# Patient Record
Sex: Male | Born: 1990 | Race: White | Hispanic: No | Marital: Single | Smoking: Former smoker
Health system: Southern US, Community
[De-identification: ages and names within clinical notes are randomized; demographics above are authoritative.]

## PROBLEM LIST (undated history)

## (undated) DIAGNOSIS — N200 Calculus of kidney: Secondary | ICD-10-CM

---

## 2009-04-25 ENCOUNTER — Emergency Department (HOSPITAL_COMMUNITY): Admission: EM | Admit: 2009-04-25 | Discharge: 2009-04-25 | Payer: Self-pay | Admitting: Emergency Medicine

## 2009-08-03 ENCOUNTER — Emergency Department (HOSPITAL_COMMUNITY): Admission: EM | Admit: 2009-08-03 | Discharge: 2009-08-04 | Payer: Self-pay | Admitting: Emergency Medicine

## 2010-06-04 LAB — POCT I-STAT, CHEM 8
Creatinine, Ser: 0.9 mg/dL (ref 0.4–1.5)
Glucose, Bld: 86 mg/dL (ref 70–99)
Hemoglobin: 18 g/dL — ABNORMAL HIGH (ref 13.0–17.0)
Potassium: 3.5 mEq/L (ref 3.5–5.1)
Sodium: 139 mEq/L (ref 135–145)
TCO2: 27 mmol/L (ref 0–100)

## 2011-07-21 IMAGING — CR DG FOOT COMPLETE 3+V*R*
3 series · 3 of 3 positions shown · non-contrast
Comparison: None.

CLINICAL DATA: Lateral pain

RIGHT FOOT COMPLETE - 3+ VIEW

[t foot ap right]
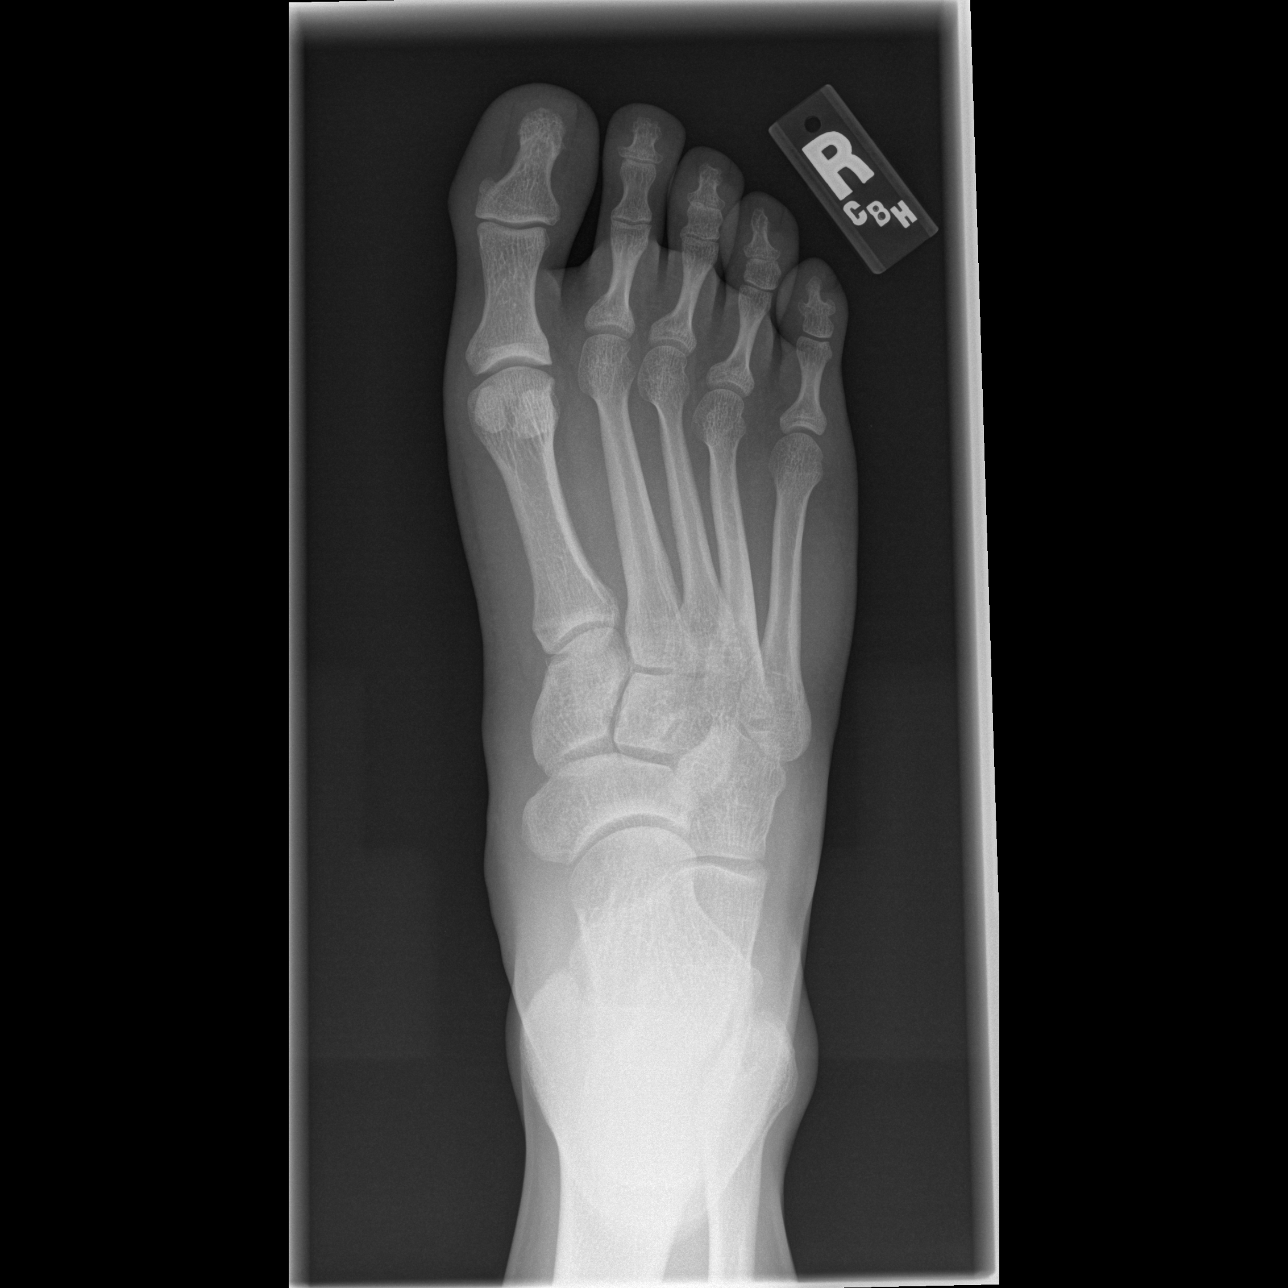

[t foot oblique right]
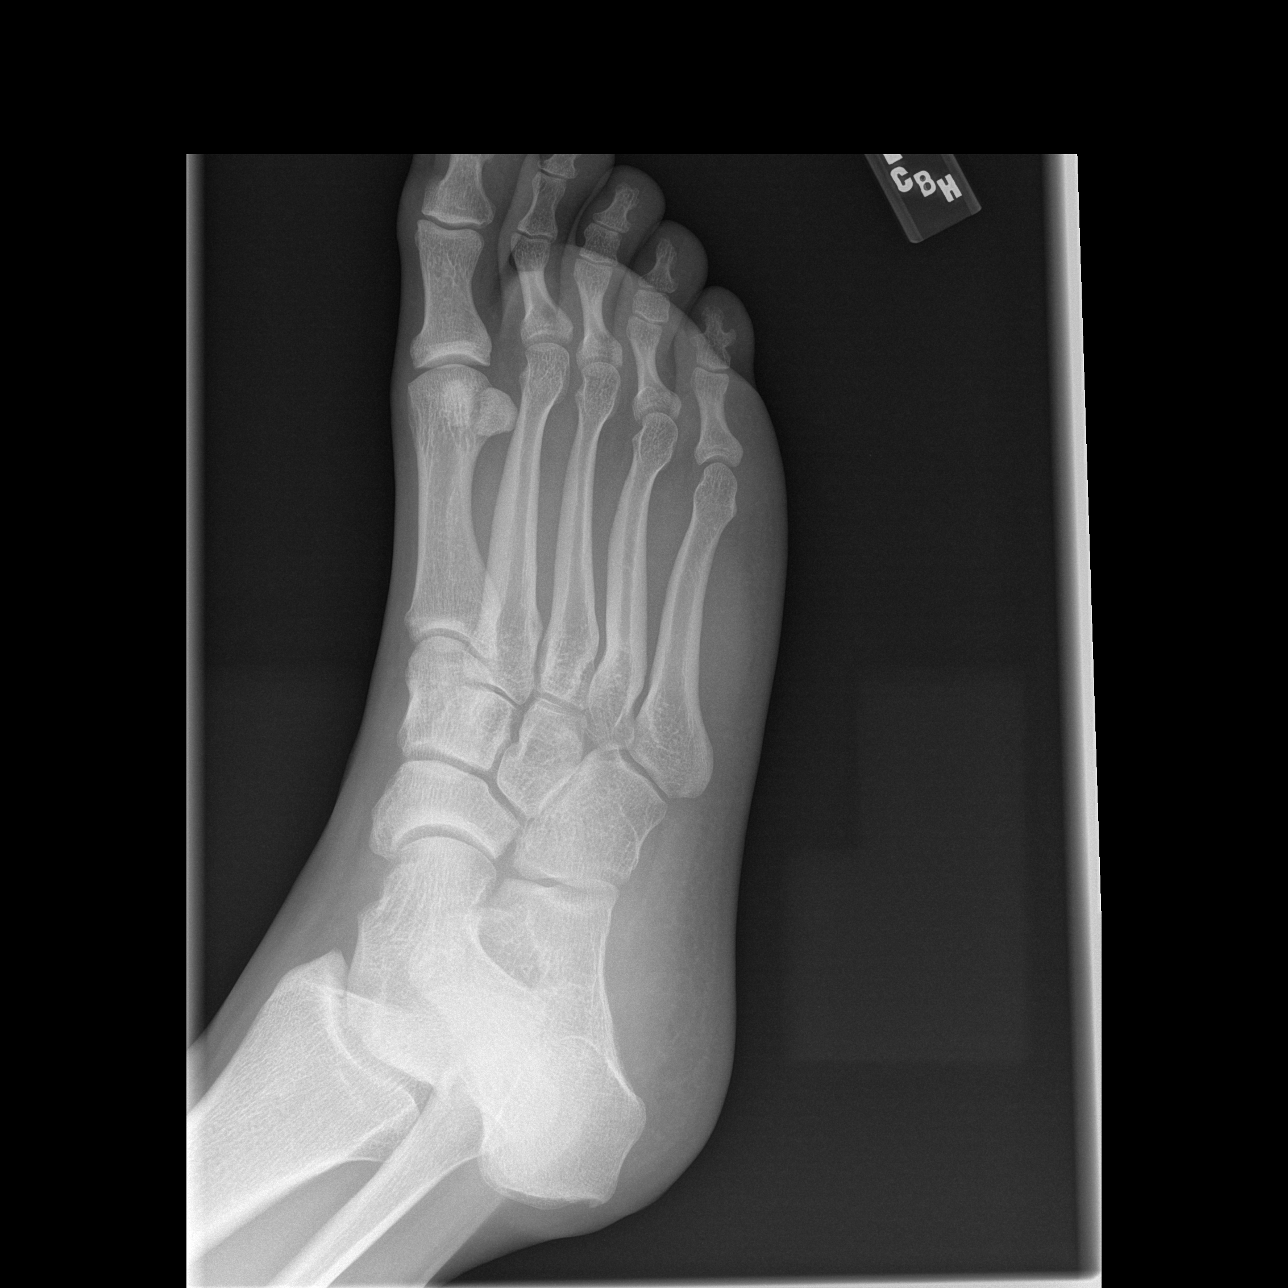

[t foot lat right]
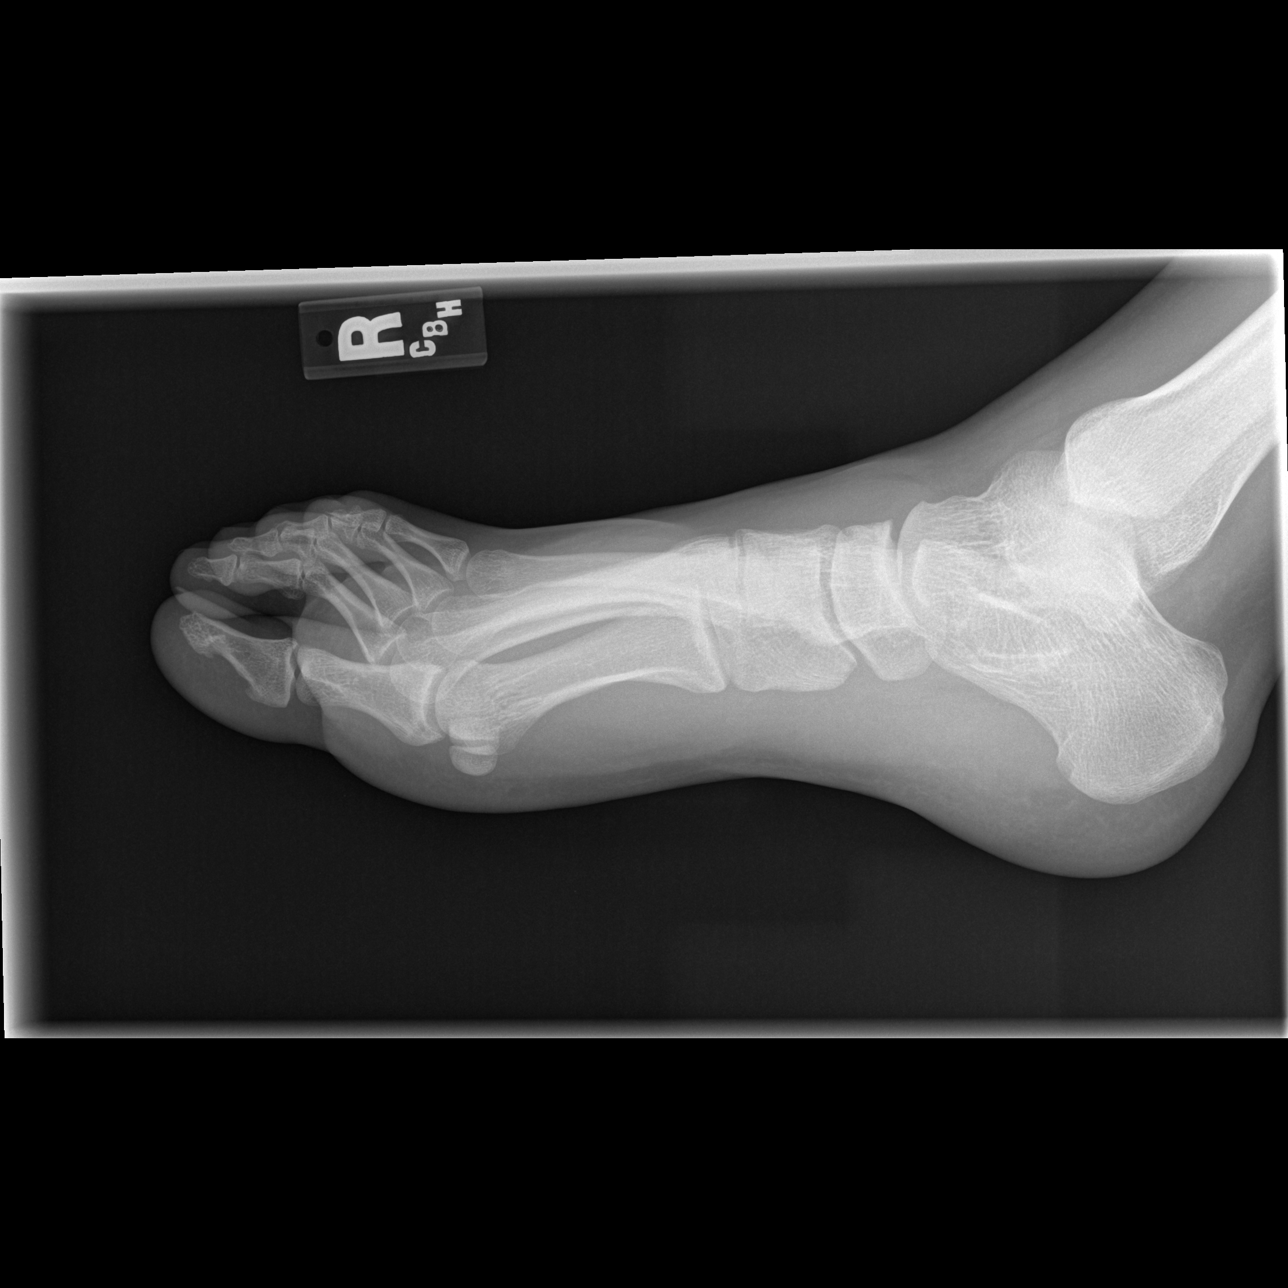

[3 of 3 positions shown; findings below may reference images not displayed]

FINDINGS: There is no evidence of fracture or dislocation.  There
is no evidence of arthropathy or other focal bony abnormality.
Soft tissues are unremarkable.
IMPRESSION: Negative

## 2014-12-28 ENCOUNTER — Emergency Department (HOSPITAL_COMMUNITY)
Admission: EM | Admit: 2014-12-28 | Discharge: 2014-12-28 | Disposition: A | Discharge status: Home / Self Care | Payer: Self-pay | Attending: Emergency Medicine | Admitting: Emergency Medicine

## 2014-12-28 ENCOUNTER — Encounter (HOSPITAL_COMMUNITY): Payer: Self-pay | Admitting: Emergency Medicine

## 2014-12-28 DIAGNOSIS — L0501 Pilonidal cyst with abscess: Secondary | ICD-10-CM | POA: Insufficient documentation

## 2014-12-28 DIAGNOSIS — Z87891 Personal history of nicotine dependence: Secondary | ICD-10-CM | POA: Insufficient documentation

## 2014-12-28 MED ORDER — ONDANSETRON 4 MG PO TBDP
4.0000 mg | ORAL_TABLET | Freq: Once | ORAL | Status: AC
  Administered 2014-12-28: 4 mg via ORAL
  Filled 2014-12-28: qty 1

## 2014-12-28 MED ORDER — LIDOCAINE-EPINEPHRINE 2 %-1:200000 IJ SOLN
10.0000 mL | Freq: Once | INTRAMUSCULAR | Status: AC
Start: 2014-12-28 — End: 2014-12-28
  Administered 2014-12-28: 10 mL
  Filled 2014-12-28: qty 20

## 2014-12-28 MED ORDER — HYDROCODONE-ACETAMINOPHEN 5-325 MG PO TABS: 1.0000 | ORAL_TABLET | ORAL | Status: AC | PRN

## 2014-12-28 MED ORDER — HYDROCODONE-ACETAMINOPHEN 5-325 MG PO TABS
2.0000 | ORAL_TABLET | Freq: Once | ORAL | Status: AC
  Administered 2014-12-28: 2 via ORAL
  Filled 2014-12-28: qty 2

## 2014-12-28 NOTE — ED Notes (Signed)
PA at bedside draining abscess

## 2014-12-28 NOTE — ED Provider Notes (Signed)
CSN: [id number]     Arrival date & time 12/28/14  0536 History   First MD Initiated Contact with Patient 12/28/14 [phone]     Chief Complaint  Patient presents with  . Abscess     (Consider location/radiation/quality/duration/timing/severity/associated sxs/prior Treatment) Patient is a 24 y.o. male presenting with abscess. The history is provided by the patient. No language interpreter was used.  Abscess Location:  Ano-genital Ano-genital abscess location:  Gluteal cleft Size:  3cm fluctuance, 6cm induration Abscess quality: fluctuance, induration, painful, redness and warmth   Red streaking: no   Duration:  1 week Progression:  Worsening Pain details:    Quality:  Pressure   Severity:  Severe   Duration:  1 week   Timing:  Constant   Progression:  Worsening Chronicity:  New Context: not diabetes, not immunosuppression, not injected drug use, not insect bite/sting and not skin injury   Relieved by:  Nothing Ineffective treatments:  NSAIDs Associated symptoms: no anorexia, no fatigue, no fever, no headaches, no nausea and no vomiting   Associated symptoms comment:  Chills Risk factors: no hx of MRSA and no prior abscess     History reviewed. No pertinent past medical history. History reviewed. No pertinent past surgical history. No family history on file. Social History  Substance Use Topics  . Smoking status: Former Games developer  . Smokeless tobacco: Current User  . Alcohol Use: No    Review of Systems  Constitutional: Negative for fever and fatigue.  Gastrointestinal: Negative for nausea, vomiting and anorexia.  Neurological: Negative for headaches.      Allergies  Abilify  Home Medications   Prior to Admission medications   Medication Sig Start Date End Date Taking? Authorizing Provider  ibuprofen (ADVIL,MOTRIN) 200 MG tablet Take 200 mg by mouth every 6 (six) hours as needed for mild pain.   Yes Historical Provider, MD  HYDROcodone-acetaminophen (NORCO) 5-325 MG  tablet Take 1-2 tablets by mouth every 4 (four) hours as needed. 12/28/14   Cyniah Gossard, PA-C   BP 129/58 mmHg  Pulse 87  Temp(Src) 98.3 F (36.8 C) (Oral)  Resp 16  Ht  (1.854 m)  Wt 230 lb (104.327 kg)  BMI 30.35 kg/m2  SpO2 99% Physical Exam  Constitutional: He appears well-developed and well-nourished. No distress.  HENT:  Head: Normocephalic and atraumatic.  Eyes: Conjunctivae are normal. No scleral icterus.  Neck: Normal range of motion. Neck supple.  Cardiovascular: Normal rate, regular rhythm and normal heart sounds.   Pulmonary/Chest: Effort normal and breath sounds normal. No respiratory distress.  Abdominal: Soft. There is no tenderness.  Musculoskeletal: He exhibits no edema.       Back:  Neurological: He is alert.  Skin: Skin is warm and dry. He is not diaphoretic.  Psychiatric: His behavior is normal.  Nursing note and vitals reviewed.   ED Course  .Marland KitchenIncision and Drainage Date/Time: 12/28/2014 7:47 AM Performed by: Arthor Captain Authorized by: Arthor Captain Consent: Verbal consent obtained. Risks and benefits: risks, benefits and alternatives were discussed Patient identity confirmed: provided demographic data Time out: Immediately prior to procedure a "time out" was called to verify the correct patient, procedure, equipment, support staff and site/side marked as required. Type: pilonidal cyst Body area: anogenital Location details: gluteal cleft Anesthesia: local infiltration Local anesthetic: lidocaine 2% with epinephrine Anesthetic total: 4 ml Patient sedated: no Scalpel size: 11 Incision type: single straight Incision depth: dermal Complexity: simple Drainage: purulent Drainage amount: moderate Wound treatment: wound left open Packing material:  none Patient tolerance: Patient tolerated the procedure well with no immediate complications   (including critical care time) Labs Review Labs Reviewed - No data to display  Imaging  Review No results found. I have personally reviewed and evaluated these images and lab results as part of my medical decision-making.   EKG Interpretation None      MDM   Final diagnoses:  Pilonidal abscess   Patient with skin abscess amenable to incision and drainage.  Abscess was not large enough to warrant packing or drain placement,  wound recheck in 2 days. Supportive care and return precautions discussed. Will d/c to home.  No antibiotic therapy is indicated.       Arthor CaptainAbigail Tamani Durney, PA-C 12/28/14 [id number]  Derwood KaplanAnkit Nanavati, MD 12/28/14 [phone]

## 2014-12-28 NOTE — Discharge Instructions (Signed)
Gentle cleansing 2x day. Change dressing 2 x day.  Incision and Drainage of a Pilonidal Cyst Incision and drainage is a surgical procedure to open and drain a fluid-filled sac that forms around a hair follicle in the tailbone area between your buttocks (pilonidal cyst). You may need this procedure if the cyst becomes painful, swollen, or infected. There are three types of procedures that may be done. The type of procedure you have depends on the size and severity of your infected cyst. The procedure may be:  Incision and drainage with a special type of bandage (wound packing). Packing is used for wounds that are deep or tunnel under the skin.  Marsupialization. In this procedure, the cyst will be opened and kept open. The edges of the incision will be stitched together to make a pocket.  Incision and drainage without wound packing. LET [facility] PROVIDER KNOW ABOUT:  Any allergies you have.  All medicines you are taking, including vitamins, herbs, eye drops, creams, and over-the-counter medicines.  Previous problems you or members of your family have had with the use of anesthetics.  Any blood disorders you have.  Previous surgeries you have had.  Medical conditions you have. RISKS AND COMPLICATIONS Generally, this is a safe procedure. However, problems can occur and include:  Infection.  Bleeding.  Having another cyst develop.  Need for more surgery. BEFORE THE PROCEDURE  Ask your health care provider about:  Changing or stopping your regular medicines. This is especially important if you are taking diabetes medicines or blood thinners.  Taking medicines such as aspirin and ibuprofen. These medicines can thin your blood. Do not take these medicines before your procedure if your health care provider tells you not to.  Taking antibiotics before surgery to control the infection.  Do not eat or drink anything for 6-8 hours before the procedure if you are having general  anesthesia.  Take a shower the night before the procedure to clean your buttocks area. Take another shower in the morning before surgery.  Plan to have someone take you home after the procedure. PROCEDURE   You will have an IV tube inserted in a vein in your hand or arm.  You will be given one of the following:  A medicine that numbs the area (local anesthetic).  A medicine that makes you go to sleep (general anesthetic).  You also may be given medicine to help you relax during the procedure (sedative).  You will lie face down on the operating table.  Your buttocks area may be shaved.  Tape may be used to spread your buttocks.  Germ-killing solution (antiseptic) may be used to clean the area. Incision and Drainage With Wound Packing:  Your surgeon will make a surgical cut (incision) over the cyst to open it.  A probe may be used to see if there are tunnels extending away from the cyst under your skin.  Fluid or pus inside the cyst will be drained.  The cyst will be flushed out with a germ-free (sterile) solution.  Packing will be placed into the open cyst. This keeps it open and draining after surgery.  The area will be covered with a bandage (dressing). Marsupialization:  Your surgeon will make a surgical cut (incision) over the cyst to open it.  A probe may be used to see if there are tunnels extending away from the cyst under your skin.  Fluid or pus inside the cyst will be drained.  The cyst will be flushed  out with a germ-free (sterile) solution.  The edges of the incision will be stitched (sutured) to the skin to keep it wide open. The cyst will not be packed.  A rolled-up bandage (dressing) will be taped over the incision. Incision and Drainage Without Packing:  Your surgeon will make a surgical cut (incision) over the cyst to open it.  A probe may be used to see if there are tunnels extending away from the cyst under your skin.  Fluid or pus inside  the cyst will be drained.  The cyst will be flushed out with a germ-free (sterile) solution.  Your surgeon may also remove the tissue around the opened cyst.  The incision then will be closed with stitches (sutures). It will not be left open, and packing will not be used.  A bandage (dressing) will be put over the incision area. AFTER THE PROCEDURE  If you had general anesthesia, you will be taken to a recovery area. Your blood pressure, heart rate, breathing rate, and blood oxygen level will be monitored often until the medicines you were given have worn off.  It is normal to have some pain after this procedure. You may be given pain medicine.  Your IV tube can be taken out after you have recovered and your pain is under control.   This information is not intended to replace advice given to you by your health care provider. Make sure you discuss any questions you have with your health care provider.   Document Released: 09/29/2013 Document Reviewed: 09/29/2013 Elsevier Interactive Patient Education 2016 [employer] Incision and Drainage, Care After Refer to this sheet in the next few weeks. These instructions provide you with information on caring for yourself after your procedure. Your caregiver may also give you more specific instructions. Your treatment has been planned according to current medical practices, but problems sometimes occur. Call your caregiver if you have any problems or questions after your procedure. HOME CARE INSTRUCTIONS   If antibiotic medicine is given, take it as directed. Finish it even if you start to feel better.  Only take over-the-counter or prescription medicines for pain, discomfort, or fever as directed by your caregiver.  Keep all follow-up appointments as directed by your caregiver.  Change any bandages (dressings) as directed by your caregiver. Replace old dressings with clean dressings.  Wash your hands before and after caring for your  wound. You will receive specific instructions for cleansing and caring for your wound.  SEEK MEDICAL CARE IF:   You have increased pain, swelling, or redness around the wound.  You have increased drainage, smell, or bleeding from the wound.  You have muscle aches, chills, or you feel generally sick.  You have a fever. MAKE SURE YOU:   Understand these instructions.  Will watch your condition.  Will get help right away if you are not doing well or get worse.   This information is not intended to replace advice given to you by your health care provider. Make sure you discuss any questions you have with your health care provider.   Document Released: 05/27/2011 Document Revised: 03/25/2014 Document Reviewed: 05/27/2011 Elsevier Interactive Patient Education [employer]

## 2014-12-28 NOTE — ED Notes (Signed)
Pt in from home reporting lower back pain, present for past several days. Denies injury, denies numbness/tingling, denies bowel/bladder issues

## 2015-07-14 ENCOUNTER — Emergency Department (HOSPITAL_COMMUNITY)
Admission: EM | Admit: 2015-07-14 | Discharge: 2015-07-14 | Disposition: A | Discharge status: Home / Self Care | Payer: Self-pay | Attending: Emergency Medicine | Admitting: Emergency Medicine

## 2015-07-14 ENCOUNTER — Encounter (HOSPITAL_COMMUNITY): Payer: Self-pay

## 2015-07-14 DIAGNOSIS — R1013 Epigastric pain: Secondary | ICD-10-CM | POA: Insufficient documentation

## 2015-07-14 DIAGNOSIS — Z87891 Personal history of nicotine dependence: Secondary | ICD-10-CM | POA: Insufficient documentation

## 2015-07-14 DIAGNOSIS — Z87442 Personal history of urinary calculi: Secondary | ICD-10-CM | POA: Insufficient documentation

## 2015-07-14 DIAGNOSIS — R112 Nausea with vomiting, unspecified: Secondary | ICD-10-CM | POA: Insufficient documentation

## 2015-07-14 DIAGNOSIS — R319 Hematuria, unspecified: Secondary | ICD-10-CM | POA: Insufficient documentation

## 2015-07-14 HISTORY — DX: Calculus of kidney: N20.0

## 2015-07-14 LAB — CBC WITH DIFFERENTIAL/PLATELET
Basophils Absolute: 0 10*3/uL (ref 0.0–0.1)
Basophils Relative: 0 %
EOS PCT: 3 %
Eosinophils Absolute: 0.2 10*3/uL (ref 0.0–0.7)
HCT: 46.2 % (ref 39.0–52.0)
Hemoglobin: 15.6 g/dL (ref 13.0–17.0)
LYMPHS ABS: 1.9 10*3/uL (ref 0.7–4.0)
LYMPHS PCT: 37 %
MCH: 28.5 pg (ref 26.0–34.0)
MCHC: 33.8 g/dL (ref 30.0–36.0)
MCV: 84.3 fL (ref 78.0–100.0)
MONO ABS: 0.5 10*3/uL (ref 0.1–1.0)
MONOS PCT: 9 %
Neutro Abs: 2.6 10*3/uL (ref 1.7–7.7)
Neutrophils Relative %: 51 %
PLATELETS: 192 10*3/uL (ref 150–400)
RBC: 5.48 MIL/uL (ref 4.22–5.81)
RDW: 13 % (ref 11.5–15.5)
WBC: 5.1 10*3/uL (ref 4.0–10.5)

## 2015-07-14 LAB — URINALYSIS, ROUTINE W REFLEX MICROSCOPIC
BILIRUBIN URINE: NEGATIVE
Glucose, UA: NEGATIVE mg/dL
HGB URINE DIPSTICK: NEGATIVE
Ketones, ur: NEGATIVE mg/dL
Leukocytes, UA: NEGATIVE
Nitrite: NEGATIVE
PROTEIN: NEGATIVE mg/dL
Specific Gravity, Urine: 1.021 (ref 1.005–1.030)
pH: 5 (ref 5.0–8.0)

## 2015-07-14 LAB — COMPREHENSIVE METABOLIC PANEL
ALT: 54 U/L (ref 17–63)
ANION GAP: 9 (ref 5–15)
AST: 32 U/L (ref 15–41)
Albumin: 4.2 g/dL (ref 3.5–5.0)
Alkaline Phosphatase: 89 U/L (ref 38–126)
BUN: 12 mg/dL (ref 6–20)
CHLORIDE: 108 mmol/L (ref 101–111)
CO2: 23 mmol/L (ref 22–32)
CREATININE: 1.05 mg/dL (ref 0.61–1.24)
Calcium: 9.5 mg/dL (ref 8.9–10.3)
Glucose, Bld: 119 mg/dL — ABNORMAL HIGH (ref 65–99)
Potassium: 4.2 mmol/L (ref 3.5–5.1)
Sodium: 140 mmol/L (ref 135–145)
Total Bilirubin: 0.6 mg/dL (ref 0.3–1.2)
Total Protein: 7 g/dL (ref 6.5–8.1)

## 2015-07-14 LAB — LIPASE, BLOOD: LIPASE: 43 U/L (ref 11–51)

## 2015-07-14 LAB — I-STAT TROPONIN, ED: TROPONIN I, POC: 0 ng/mL (ref 0.00–0.08)

## 2015-07-14 MED ORDER — SODIUM CHLORIDE 0.9 % IV BOLUS (SEPSIS)
1000.0000 mL | Freq: Once | INTRAVENOUS | Status: AC
  Administered 2015-07-14: 1000 mL via INTRAVENOUS

## 2015-07-14 MED ORDER — FAMOTIDINE IN NACL 20-0.9 MG/50ML-% IV SOLN
20.0000 mg | Freq: Once | INTRAVENOUS | Status: AC
  Administered 2015-07-14: 20 mg via INTRAVENOUS
  Filled 2015-07-14: qty 50

## 2015-07-14 MED ORDER — FAMOTIDINE 20 MG PO TABS: 20.0000 mg | ORAL_TABLET | Freq: Two times a day (BID) | ORAL | Status: AC

## 2015-07-14 MED ORDER — ONDANSETRON 4 MG PO TBDP: 4.0000 mg | ORAL_TABLET | Freq: Three times a day (TID) | ORAL | Status: AC | PRN

## 2015-07-14 MED ORDER — ONDANSETRON HCL 4 MG/2ML IJ SOLN
4.0000 mg | Freq: Once | INTRAMUSCULAR | Status: AC
  Administered 2015-07-14: 4 mg via INTRAVENOUS
  Filled 2015-07-14: qty 2

## 2015-07-14 MED ORDER — PANTOPRAZOLE SODIUM 20 MG PO TBEC: 20.0000 mg | DELAYED_RELEASE_TABLET | Freq: Every day | ORAL | Status: AC

## 2015-07-14 MED ORDER — GI COCKTAIL ~~LOC~~
30.0000 mL | Freq: Once | ORAL | Status: AC
  Administered 2015-07-14: 30 mL via ORAL
  Filled 2015-07-14: qty 30

## 2015-07-14 NOTE — ED Provider Notes (Signed)
CSN: [id number]     Arrival date & time 07/14/15  1034 History   First MD Initiated Contact with Patient 07/14/15 1047     Chief Complaint  Patient presents with  . Hematuria  . Abdominal Pain     (Consider location/radiation/quality/duration/timing/severity/associated sxs/prior Treatment) HPI   Dalton Lindsey is a 25 y.o. male, with a history of kidney stone, presenting to the ED with Intermittent epigastric pain accompanied by nausea and vomiting for the last week. Patient also complains of hematuria for the past 2 days. Patient endorses a previous history of a kidney stone, however, today's pain is different and more intense. Patient rates his pain at 5 out of 10, describes it as a squeezing or cramping, nonradiating. The pain does not seem to be associated with eating, however, the vomiting only occurs when he eats. The pain is worse when the patient lifts heavy objects. Patient has not tried anything for the pain. Patient denies lower abdominal pain, pain with bowel movements, penile discharge, dysuria, fever/chills, hematochezia/melena, chest pain, shortness breath, or any other complaints.    Past Medical History  Diagnosis Date  . Kidney stone    History reviewed. No pertinent past surgical history. History reviewed. No pertinent family history. Social History  Substance Use Topics  . Smoking status: Former Games developermoker  . Smokeless tobacco: Current User  . Alcohol Use: No    Review of Systems  Constitutional: Negative for fever, chills and diaphoresis.  Respiratory: Negative for cough and shortness of breath.   Cardiovascular: Negative for chest pain.  Gastrointestinal: Positive for nausea, vomiting and abdominal pain. Negative for diarrhea, constipation and blood in stool.  Genitourinary: Positive for hematuria. Negative for dysuria, flank pain, decreased urine volume, discharge, penile swelling, scrotal swelling, penile pain and testicular pain.  Musculoskeletal: Negative for  back pain.  Skin: Negative for color change, pallor and rash.  All other systems reviewed and are negative.     Allergies  Abilify  Home Medications   Prior to Admission medications   Medication Sig Start Date End Date Taking? Authorizing Provider  famotidine (PEPCID) 20 MG tablet Take 1 tablet (20 mg total) by mouth 2 (two) times daily. 07/14/15   Juleen Sorrels C Griselda Tosh, PA-C  HYDROcodone-acetaminophen (NORCO) 5-325 MG tablet Take 1-2 tablets by mouth every 4 (four) hours as needed. 12/28/14   Arthor CaptainAbigail Harris, PA-C  ibuprofen (ADVIL,MOTRIN) 200 MG tablet Take 200 mg by mouth every 6 (six) hours as needed for mild pain.    Historical Provider, MD  ondansetron (ZOFRAN ODT) 4 MG disintegrating tablet Take 1 tablet (4 mg total) by mouth every 8 (eight) hours as needed for nausea or vomiting. 07/14/15   Haidy Kackley C Roquel Burgin, PA-C  pantoprazole (PROTONIX) 20 MG tablet Take 1 tablet (20 mg total) by mouth daily. 07/14/15   Seymore Brodowski C Korianna Washer, PA-C   BP 121/56 mmHg  Pulse 77  Temp(Src) 98.7 F (37.1 C) (Oral)  Resp 18  Ht 6\' 1"  (1.854 m)  Wt 108.863 kg  BMI 31.67 kg/m2  SpO2 96% Physical Exam  Constitutional: He appears well-developed and well-nourished. No distress.  HENT:  Head: Normocephalic and atraumatic.  Eyes: Conjunctivae are normal. Pupils are equal, round, and reactive to light.  Neck: Neck supple.  Cardiovascular: Normal rate, regular rhythm, normal heart sounds and intact distal pulses.   Pulmonary/Chest: Effort normal and breath sounds normal. No respiratory distress.  Abdominal: Soft. Normal appearance and bowel sounds are normal. There is tenderness in the epigastric area. There is  no guarding and no CVA tenderness.  Musculoskeletal: He exhibits no edema or tenderness.  Lymphadenopathy:    He has no cervical adenopathy.  Neurological: He is alert.  Skin: Skin is warm and dry. He is not diaphoretic.  Psychiatric: He has a normal mood and affect. His behavior is normal.  Nursing note and vitals  reviewed.   ED Course  Procedures (including critical care time) Labs Review Labs Reviewed  COMPREHENSIVE METABOLIC PANEL - Abnormal; Notable for the following:    Glucose, Bld 119 (*)    All other components within normal limits  URINE CULTURE  CBC WITH DIFFERENTIAL/PLATELET  LIPASE, BLOOD  URINALYSIS, ROUTINE W REFLEX MICROSCOPIC (NOT AT [facility])  I-STAT TROPOININ, ED  GC/CHLAMYDIA PROBE AMP ([place]) NOT AT [facility]    Imaging Review No results found. I have personally reviewed and evaluated these images and lab results as part of my medical decision-making.   EKG Interpretation   Date/Time:  Friday July 14 2015 11:18:08 EDT Ventricular Rate:  80 PR Interval:  156 QRS Duration: 107 QT Interval:  361 QTC Calculation: 416 R Axis:   -14 Text Interpretation:  Sinus rhythm Normal ECG No old tracing to compare  Confirmed by GOLDSTON  MD, SCOTT ([id number]) on 07/14/2015 11:20:08 AM Also  confirmed by Criss Alvine  MD, SCOTT ([id number]), editor [place] CT, Jola Babinski [phone])   on 07/14/2015 11:59:30 AM       Medications  ondansetron (ZOFRAN) injection 4 mg (4 mg Intravenous Given 07/14/15 1123)  famotidine (PEPCID) IVPB 20 mg premix (0 mg Intravenous Stopped 07/14/15 1154)  gi cocktail (Maalox,Lidocaine,Donnatal) (30 mLs Oral Given 07/14/15 1127)  sodium chloride 0.9 % bolus 1,000 mL (0 mLs Intravenous Stopped 07/14/15 1238)     MDM   Final diagnoses:  Epigastric pain  Non-intractable vomiting with nausea, vomiting of unspecified type    Dalton Limbo presents with epigastric pain accompanied by nausea and vomiting for the last week. Patient also complains of painless hematuria for the last 2 days.  Findings and plan of care discussed with Pricilla Loveless, MD.   This patient's presentation is suspicious for peptic ulcer disease versus hiatal hernia. I believe that the hematuria is a separate complaint. Patient's pain improved with Pepcid and GI cocktail. No hematuria or other abnormalities  on the patient's UA. His labs are normal. No indication of infection. Patient discharged with Protonix and Pepcid, told to follow-up with GI. Return precautions discussed. Patient voiced understanding of these instructions, agreed to the plan, and is comfortable with discharge.  Filed Vitals:   07/14/15 1053 07/14/15 1231  BP: 136/88 121/56  Pulse: 84 77  Temp: 98.7 F (37.1 C)   TempSrc: Oral   Resp: 16 18  Height:  (1.854 m)   Weight: 108.863 kg   SpO2: 98% 96%      Anselm Pancoast, PA-C 07/14/15 1943  Pricilla Loveless, MD 07/19/15 1500

## 2015-07-14 NOTE — Discharge Instructions (Signed)
You have been seen today for upper abdominal pain. Your lab tests showed no abnormalities. Your symptoms are consistent with a stomach ulcer or hernia. You have been prescribed medications that will help protect your stomach if you do have an ulcer. It is very important that you take these medications as prescribed. The Protonix needs to be taken daily, 30 minutes before your first meal, no matter how you feel. Zofran is for nausea and vomiting. Take the Pepcid twice a day for 7 days. You should also follow up with gastroenterology. Call the number provided to set up an appointment as soon as possible. Follow up with PCP as needed. Return to ED should symptoms worsen.  RESOURCE GUIDE  Chronic Pain Problems: Contact [facility]  [phone] Patients need to be referred by their primary care doctor.  Insufficient Money for Medicine: Contact United Way:  call "211" or [facility] 936-226-0227.  No Primary Care Doctor: - Call [facility]  [phone] - can help you locate a primary care doctor that  accepts your insurance, provides certain services, etc. - Physician Referral Service- [phone]  Agencies that provide inexpensive medical care: - [facility]  [phone] - [facility]  [phone] - [facility]  [phone] - Women's Clinic  [phone] - Planned Parenthood  [phone] [facility]  [phone]  Medicaid-accepting [facility] Providers: - [facility]- [address]  [phone], Mon-Fri 9am-7pm, Sat 9am-1pm - [facility]- [address] [place], [address] [place]  [phone] - [facility]- [address], [address] [place]  [phone] [facility]- [address]  [phone] - Renaye Rakers- [address] [place], [address], [phone]  Only accepts [place] patients after they have their name  applied to their  card  Self Pay (no insurance) in [place]: - Sickle Cell Patients: Dr Willey Blade, [facility]  [address] [place], [phone] - [facility]- [address] [place]  [phone]       [facility] [place]- [address]       -     [facility]- see information above (Speak to [employer] if you do not have insurance)       -  [facility]- [address] [place], [phone]       -  [facility]- [address],  [phone]       -  [facility]- [address], [phone]       -  Dr Julio Sicks-  [address], [place], [phone]       -  [facility]- [address] [phone]       -  [facility]- [address], [phone], also [address], [phone]       -    [facility]- [address] [place], [phone], 1st & 3rd Saturday   every month, 10am-1pm  1) Find a Doctor and Pay Out of Pocket Although you won't have to find out who is covered by your insurance plan, it is a good idea to ask around and get recommendations. You will then need to call the office and see if the doctor you have chosen will accept you as a  new patient and what types of options they offer for patients who are self-pay. Some doctors offer discounts or will set up payment plans for their patients who do not have insurance, but you will need to ask so you aren't surprised when you get to your appointment.  2) Contact Your Local Health Department Not all health departments have doctors that can see patients for sick visits, but many do, so it is worth a call to see if yours does. If you don't know where your local health department is, you can check in your phone book. The CDC also has a tool to help you locate your state's health department, and many state websites also have listings of all of their local health departments.  3) Find a Walk-in Clinic If your illness is not  likely to be very severe or complicated, you may want to try a walk in clinic. These are popping up all over the country in pharmacies, drugstores, and shopping centers. They're usually staffed by nurse practitioners or physician assistants that have been trained to treat common illnesses and complaints. They're usually fairly quick and inexpensive. However, if you have serious medical issues or chronic medical problems, these are probably not your best option  STD Testing - [facility] [place], STD Clinic, [address], [place], phone [phone] or [phone].  Monday - Friday, call for an appointment. [facility]- [place] [facility] of [facility] [place], STD Clinic, [place], [place], phone [phone] or [phone].  Monday - Friday, call for an appointment.  Abuse/Neglect: [facility]- [place] Child Abuse Hotline (865)121-9935(336) 979-471-9185 [facility]- [place] Child Abuse Hotline 575-363-1358309-810-0618 (After Hours)  Emergency Shelter:  [facility] (906) 201-4684(336) (315)227-7017  Maternity Homes: - [facility] (209) 706-6015(336) (954)118-5628 - [facility] (850)668-2768(704) 646-177-1028  MRSA Hotline #:   (301)253-4690210-078-5976  [facility] Resources  [facility] [place]  United Way [facility]. [address]                 [address]         [address]  [place]                                               [place]                              [place] Phone:  [phone]                                  Phone:  [phone]                   Phone:  [phone]  [facility], [phone] - [facility] Services - [facility]- [phone]) [phone]       -     [facility] in [place], [address],                                  [phone], Insurance  [place] Child Abuse Hotline 531-130-0004(336) 873 728 7054 or 820-800-1104(336) (504) 082-3870 (After Hours)  Behavioral  Health Services  Substance Abuse Resources: - [facility]  (971)193-9892 - [facility] (925)571-3205 - [facility] (281) 455-0187 Floydene Flock (985)031-8379 - Residential & Outpatient Substance Abuse Program  (307)269-9847  Psychological Services: [facility]  (915) [phone] [facility]  7656204541 - [facility], ([phone] [place]. [address], [place], ACCESS LINE: 848-111-5955 or 941-623-5426, [email or web address]  Dental Assistance  If unable to pay or uninsured, contact:  [facility] or [facility]. to become qualified for the adult dental clinic.  Patients with Medicaid: [facility] (509)546-8506 [address], (251) [phone], [phone]  If unable to pay, or uninsured, contact [facility] 819-437-3544) or [facility] [phone] in [place], [phone] in [facility]) to become qualified for the adult dental clinic   Other Low-Cost Community Dental Services: - [facility]- [address] [place], [place], [place], [address], [phone], [address], 2nd and 4th Thursday of the month at 6:30am.  10 clients each day by appointment, can sometimes see walk-in patients if someone does not show for an appointment. [facility]- [address] [place], [place], [address], [phone] - [facility]- [address], [place], [place], [address], [phone] - [facility]- [phone] [facility] Department- [phone] [facility] Department- [phone]

## 2015-07-14 NOTE — ED Notes (Addendum)
Pt c/o intermittent upper abd pain w/ straining x 1 week w/ n/v. Pt reports hematuria x 2 days. Pt reports 1 kidney stone years ago.

## 2015-07-14 NOTE — ED Notes (Signed)
Pt verbalized understanding of d/c instructions, prescriptions, and follow-up care. No further questions/concerns, VSS, ambulatory w/ steady gait (refused wheelchair) 

## 2015-07-14 NOTE — ED Notes (Signed)
Patient d/c'd from IV, monitor, continuous pulse oximetry and blood pressure cuff; patient getting dressed to be discharged home; visitor at bedside 

## 2015-07-15 LAB — URINE CULTURE: Culture: NO GROWTH

## 2015-07-17 LAB — GC/CHLAMYDIA PROBE AMP (~~LOC~~) NOT AT ARMC
CHLAMYDIA, DNA PROBE: POSITIVE — AB
NEISSERIA GONORRHEA: NEGATIVE

## 2015-07-18 ENCOUNTER — Telehealth (HOSPITAL_BASED_OUTPATIENT_CLINIC_OR_DEPARTMENT_OTHER): Payer: Self-pay | Admitting: Emergency Medicine

## 2015-07-18 NOTE — Telephone Encounter (Signed)
Chart handoff to EDP for treatment plan for + Chlamydia 

## 2015-07-18 NOTE — Telephone Encounter (Signed)
Post ED Visit - Positive Culture Follow-up: Successful Patient Follow-Up  Culture assessed and recommendations reviewed by: []  Enzo BiNathan Batchelder, Pharm.D. []  Celedonio MiyamotoJeremy Frens, Pharm.D., BCPS []  Garvin FilaMike Maccia, Pharm.D. []  Georgina PillionElizabeth Martin, Pharm.D., BCPS []  [place]my[place]h Pham, [address] BoulevardPharm.D., BCPS, AAHIVP []  Estella HuskMichelle Turner, Pharm.D., BCPS, AAHIVP []  Tennis Mustassie Stewart, Pharm.D. []  Sherle Poeob Vincent, [address] BoulevardPharm.D.  Positive Chlamydia culture  [x]  Patient discharged without antimicrobial prescription and treatment is now indicated []  Organism is resistant to prescribed ED discharge antimicrobial []  Patient with positive blood cultures  Changes discussed with ED provider:  Renne CriglerJoshua Geiple PA New antibiotic prescription Azithromycin 1000mg  po x 1  Attempting to contact patient   Dalton MullMiller, Dalton Lindsey 07/18/2015, 3:52 PM

## 2023-09-16 DEATH — deceased
